# Patient Record
Sex: Male | Born: 1950 | Race: White | Hispanic: No
Health system: Southern US, Community
[De-identification: ages and names within clinical notes are randomized; demographics above are authoritative.]

---

## 2019-10-25 ENCOUNTER — Emergency Department
Admission: EM | Admit: 2019-10-25 | Discharge: 2019-10-29 | Disposition: A | Discharge status: Home / Self Care | Payer: No Typology Code available for payment source | Attending: Emergency Medicine | Admitting: Emergency Medicine

## 2019-10-25 ENCOUNTER — Other Ambulatory Visit: Payer: Self-pay

## 2019-10-25 ENCOUNTER — Emergency Department: Payer: No Typology Code available for payment source

## 2019-10-25 DIAGNOSIS — E119 Type 2 diabetes mellitus without complications: Secondary | ICD-10-CM | POA: Insufficient documentation

## 2019-10-25 DIAGNOSIS — R410 Disorientation, unspecified: Secondary | ICD-10-CM | POA: Diagnosis not present

## 2019-10-25 DIAGNOSIS — I10 Essential (primary) hypertension: Secondary | ICD-10-CM | POA: Insufficient documentation

## 2019-10-25 DIAGNOSIS — E860 Dehydration: Secondary | ICD-10-CM | POA: Insufficient documentation

## 2019-10-25 DIAGNOSIS — R4182 Altered mental status, unspecified: Secondary | ICD-10-CM | POA: Diagnosis present

## 2019-10-25 LAB — COMPREHENSIVE METABOLIC PANEL
ALT: 30 U/L (ref 0–44)
AST: 52 U/L — ABNORMAL HIGH (ref 15–41)
Albumin: 3.2 g/dL — ABNORMAL LOW (ref 3.5–5.0)
Alkaline Phosphatase: 51 U/L (ref 38–126)
Anion gap: 11 (ref 5–15)
BUN: 28 mg/dL — ABNORMAL HIGH (ref 8–23)
CO2: 25 mmol/L (ref 22–32)
Calcium: 9.1 mg/dL (ref 8.9–10.3)
Chloride: 95 mmol/L — ABNORMAL LOW (ref 98–111)
Creatinine, Ser: 1.34 mg/dL — ABNORMAL HIGH (ref 0.61–1.24)
GFR calc Af Amer: >60 mL/min (ref >60)
GFR calc non Af Amer: 54 mL/min — ABNORMAL LOW (ref >60)
Glucose, Bld: 126 mg/dL — ABNORMAL HIGH (ref 70–99)
Potassium: 4.5 mmol/L (ref 3.5–5.1)
Sodium: 131 mmol/L — ABNORMAL LOW (ref 135–145)
Total Bilirubin: 0.6 mg/dL (ref 0.3–1.2)
Total Protein: 7.5 g/dL (ref 6.5–8.1)

## 2019-10-25 LAB — CBC
HCT: 40.8 % (ref 39.0–52.0)
Hemoglobin: 13.9 g/dL (ref 13.0–17.0)
MCH: 29.1 pg (ref 26.0–34.0)
MCHC: 34.1 g/dL (ref 30.0–36.0)
MCV: 85.5 fL (ref 80.0–100.0)
Platelets: 197 10*3/uL (ref 150–400)
RBC: 4.77 MIL/uL (ref 4.22–5.81)
RDW: 12 % (ref 11.5–15.5)
WBC: 7 10*3/uL (ref 4.0–10.5)
nRBC: 0 % (ref 0.0–0.2)

## 2019-10-25 LAB — GLUCOSE, CAPILLARY: Glucose-Capillary: 110 mg/dL — ABNORMAL HIGH (ref 70–99)

## 2019-10-25 NOTE — ED Triage Notes (Signed)
Pt arrives to ED from home via [facility] with c/c of confusion. EMS states that pts daughters were concerned that he has increasing memory loss with patient forgetting where he is driving, and occasional delusional thinking. Transport vitals reported as 134/89, p110, O2 sat 94% on room air. CBG 136. Upon arrival, pt A&Ox4, NAD, no respiratory Sx  Evident.

## 2019-10-26 ENCOUNTER — Emergency Department: Payer: No Typology Code available for payment source

## 2019-10-26 LAB — URINALYSIS, COMPLETE (UACMP) WITH MICROSCOPIC
Bacteria, UA: NONE SEEN
Bilirubin Urine: NEGATIVE
Glucose, UA: NEGATIVE mg/dL
Ketones, ur: 5 mg/dL — AB
Leukocytes,Ua: NEGATIVE
Nitrite: NEGATIVE
Protein, ur: NEGATIVE mg/dL
Specific Gravity, Urine: 1.018 (ref 1.005–1.030)
Squamous Epithelial / HPF: NONE SEEN (ref 0–5)
pH: 5 (ref 5.0–8.0)

## 2019-10-26 LAB — URINE DRUG SCREEN, QUALITATIVE (ARMC ONLY)
Amphetamines, Ur Screen: NOT DETECTED
Barbiturates, Ur Screen: NOT DETECTED
Benzodiazepine, Ur Scrn: NOT DETECTED
Cannabinoid 50 Ng, Ur ~~LOC~~: NOT DETECTED
Cocaine Metabolite,Ur ~~LOC~~: NOT DETECTED
MDMA (Ecstasy)Ur Screen: NOT DETECTED
Methadone Scn, Ur: NOT DETECTED
Opiate, Ur Screen: NOT DETECTED
Phencyclidine (PCP) Ur S: NOT DETECTED
Tricyclic, Ur Screen: NOT DETECTED

## 2019-10-26 LAB — AMMONIA: Ammonia: 15 umol/L (ref 9–35)

## 2019-10-26 LAB — ETHANOL: Alcohol, Ethyl (B): <10 mg/dL (ref <10)

## 2019-10-26 MED ORDER — SODIUM CHLORIDE 0.9 % IV BOLUS
1000.0000 mL | Freq: Once | INTRAVENOUS | Status: AC
  Administered 2019-10-26: 1000 mL via INTRAVENOUS

## 2019-10-26 NOTE — ED Provider Notes (Addendum)
[facility] Emergency Department Provider Note  ____________________________________________  Time seen: Approximately 1:27 AM  I have reviewed the triage vital signs and the nursing notes.   HISTORY  Chief Complaint Altered Mental Status   HPI Shannon Daniels is a 68 y.o. male the history of diabetes, hypertension, bipolar disorder who presents for evaluation of altered mental status.  Patient is accompanied by his daughter who is a Marine scientist at [facility] [employer] [facility] [employer] emergency department.  According to her patient has had mild episodes of confusion and memory loss over the last several months. Today, however patient became extremely confused, sudden onset. Patient was picking up his granddaughter when he he became extremely confused, did not know where he was, was having difficulty driving the car, pulled into a parking lot and would not drive away.  They were eventually able to get home.  Granddaughter got out of the car and called her mother.  She noticed that the patient had not come inside the house and went to check on him.  He was fallen on the ground outside of the house.  When EMS arrived patient was was neuro intact and brought to the emergency room.  Patient endorses feeling very confused but denies headache, neck pain, back pain, chest pain, abdominal pain, nausea, vomiting.  Has had no fever, no cough, no dysuria or hematuria.  He does not drink, does not use drugs or smokes. No new medications  PMH DM HTN Bipolar  Allergies Patient has no allergy information on record.  No family history on file.  Social History Smoking - no Alcohol - no Drugs - no   Review of Systems  Constitutional: Negative for fever. + confusion Eyes: Negative for visual changes. ENT: Negative for sore throat. Neck: No neck pain  Cardiovascular: Negative for chest pain. Respiratory: Negative for shortness of breath. Gastrointestinal: Negative for abdominal pain, vomiting or diarrhea.  Genitourinary: Negative for dysuria. Musculoskeletal: Negative for back pain. Skin: Negative for rash. Neurological: Negative for headaches, weakness or numbness. Psych: No SI or HI  ____________________________________________   PHYSICAL EXAM:  VITAL SIGNS: ED Triage Vitals  Enc Vitals Group     BP 10/25/19 2257 (!) 126/92     Pulse Rate 10/25/19 2257 (!) 102     Resp 10/25/19 2257 16     Temp 10/25/19 2257 99.3 F (37.4 C) - 98.91F repeat     Temp Source 10/25/19 2257 Oral     SpO2 10/25/19 2257 100 %     Weight 10/25/19 2258 230 lb (104.3 kg)     Height 10/25/19 2258 5\' 8"  (1.727 m)     Head Circumference --      Peak Flow --      Pain Score 10/25/19 2258 0     Pain Loc --      Pain Edu? --      Excl. in [place]? --     Constitutional: Alert and oriented to self, thinks he is at [facility], does not know the year, month, or season, does not know the president. No distress HEENT:      Head: Normocephalic and atraumatic.         Eyes: Conjunctivae are normal. Sclera is non-icteric.       Mouth/Throat: Mucous membranes are moist.       Neck: Supple with no signs of meningismus. Cardiovascular: Regular rate and rhythm. No murmurs, gallops, or rubs. 2+ symmetrical distal pulses are present in all extremities. No JVD. Respiratory: Normal respiratory  effort. Lungs are clear to auscultation bilaterally. No wheezes, crackles, or rhonchi.  Gastrointestinal: Soft, non tender, and non distended with positive bowel sounds. No rebound or guarding. Musculoskeletal: Nontender with normal range of motion in all extremities. No edema, cyanosis, or erythema of extremities. Neurologic: Normal speech and language. Face is symmetric. EOMI, PERRL, strength and sensation x 4 intact, no dysmetria or pronator drift. Able to read and name objects with no difficulty finding words.  Skin: Skin is warm, dry and intact. No rash noted. Psychiatric: Mood and affect are normal. Speech and behavior are  normal.  ____________________________________________   LABS (all labs ordered are listed, but only abnormal results are displayed)  Labs Reviewed  COMPREHENSIVE METABOLIC PANEL - Abnormal; Notable for the following components:      Result Value   Sodium 131 (*)    Chloride 95 (*)    Glucose, Bld 126 (*)    BUN 28 (*)    Creatinine, Ser 1.34 (*)    Albumin 3.2 (*)    AST 52 (*)    GFR calc non Af Amer 54 (*)    All other components within normal limits  URINALYSIS, COMPLETE (UACMP) WITH MICROSCOPIC - Abnormal; Notable for the following components:   Color, Urine YELLOW (*)    APPearance CLEAR (*)    Hgb urine dipstick SMALL (*)    Ketones, ur 5 (*)    All other components within normal limits  GLUCOSE, CAPILLARY - Abnormal; Notable for the following components:   Glucose-Capillary 110 (*)    All other components within normal limits  CBC  ETHANOL  URINE DRUG SCREEN, QUALITATIVE ([facility] ONLY)  AMMONIA  CBG MONITORING, ED   ____________________________________________  EKG  ED ECG REPORT I, Nita Sickle, the attending physician, personally viewed and interpreted this ECG.  Sinus tachycardia, rate of 100, normal intervals, left axis deviation, no ST elevations or depressions.  No prior for comparison ____________________________________________  RADIOLOGY  I have personally reviewed the images performed during this visit and I agree with the Radiologist's read.   Interpretation by Radiologist:  Ct Head Wo Contrast  Result Date: 10/26/2019 CLINICAL DATA:  Confusion EXAM: CT HEAD WITHOUT CONTRAST TECHNIQUE: Contiguous axial images were obtained from the base of the skull through the vertex without intravenous contrast. COMPARISON:  None. FINDINGS: Brain: Mild atrophic changes are noted. No findings to suggest acute hemorrhage, acute infarction or space-occupying mass lesion are noted. Vascular: No hyperdense vessel or unexpected calcification. Skull: Normal.  Negative for fracture or focal lesion. Sinuses/Orbits: No acute finding. Other: None. IMPRESSION: Mild atrophic changes without acute abnormality. Electronically Signed   By: Alcide Clever M.D.   On: 10/26/2019 00:18   Mr Brain Wo Contrast  Result Date: 10/26/2019 CLINICAL DATA:  Altered mental status with unclear cause. Progressive memory loss EXAM: MRI HEAD WITHOUT CONTRAST TECHNIQUE: Multiplanar, multiecho pulse sequences of the brain and surrounding structures were obtained without intravenous contrast. COMPARISON:  None. FINDINGS: Brain: No acute infarction, hemorrhage, hydrocephalus, extra-axial collection or mass lesion. Overall mild cerebral volume loss for age. Medial temporal volume is preserved. Unremarkable cerebral white matter. Vascular: Normal flow voids Skull and upper cervical spine: Normal marrow signal Sinuses/Orbits: Generalized mucosal thickening in paranasal sinuses. Bilateral cataract resection. IMPRESSION: 1. No acute or reversible finding. No specific cause of memory loss. 2. Mild cerebral atrophy. 3. Generalized chronic appearing sinusitis Electronically Signed   By: Marnee Spring M.D.   On: 10/26/2019 04:17     ____________________________________________   PROCEDURES  Procedure(s) performed: None Procedures Critical Care performed:  None ____________________________________________   INITIAL IMPRESSION / ASSESSMENT AND PLAN / ED COURSE   68 y.o. male the history of diabetes, hypertension, bipolar disorder who presents for evaluation of altered mental status.  Patient is alert and oriented to self, thinks he is [facility], does not know the year, month, or season, does not know the president.  He does seem slightly confused and has good insight into his confusion.  He denies any medical complaints.  His vitals show mild tachycardia with a pulse of 100, afebrile with a temp of 98.98F during my evaluation.  He is otherwise completely neurologically intact.  No meningeal signs, no fever, no HA or neck pain.  Labs consistent with mild dehydration with ketones in his urine.  CMP showing creatinine of 1.34 but normal electrolytes.  No prior labs available for comparison. CBC within normal limits. UA negative for UTI.  Drug screen negative.  Alcohol level negative. CT head negative for bleed or any acute findings. Will get MRI to rule out stroke. Will give IVF. Ddx stroke, dementia, infection, dehydration.    _________________________ 4:48 AM on 10/26/2019 -----------------------------------------  MRI negative. Patient less confused, knows the president, knows about recent events including COVID and elections, still confused on year. Ammonia negative. Unclear cause of his symptoms, possibly dementia. Will refer back to PCP for further evaluation. In the meantime, discussed with daughter to prevent patient from driving and close monitoring at home. Discussed my standard return precautions.   _________________________ 6:46 AM on 10/26/2019 -----------------------------------------  IV infiltrated and about 500 cc of NS infiltrated on patient's left arm. Limb was elevated, ice applied and patient was monitored for 1 hour post infiltration with significant improvement of the swelling.  Compartments are very soft, no pain, brisk capillary refill and normal distal pulses.  Recommended elevation and ice at home.  Discussed return precautions for signs of compartment syndrome  As part of my medical decision making, I reviewed the following data within the electronic MEDICAL RECORD NUMBER History obtained from family, Nursing notes reviewed and incorporated, Labs reviewed , EKG interpreted , Radiograph reviewed , Notes from prior ED visits and [place] Controlled Substance Database   Patient was evaluated in Emergency Department today for the symptoms described in the history of present illness. Patient was evaluated in the context of the global COVID-19 pandemic, which  necessitated consideration that the patient might be at risk for infection with the SARS-CoV-2 virus that causes COVID-19. Institutional protocols and algorithms that pertain to the evaluation of patients at risk for COVID-19 are in a state of rapid change based on information released by regulatory bodies including the CDC and federal and state organizations. These policies and algorithms were followed during the patient's care in the ED.   ____________________________________________   FINAL CLINICAL IMPRESSION(S) / ED DIAGNOSES   Final diagnoses:  Confusion  Dehydration      NEW MEDICATIONS STARTED DURING THIS VISIT:  ED Discharge Orders    None       Note:  This document was prepared using Dragon voice recognition software and may include unintentional dictation errors.    Nita SickleVeronese, [place], MD 10/26/19 [id number]    Nita SickleVeronese, [place], MD 10/26/19 [id number]    Nita SickleVeronese, [place], MD 10/26/19 [phone]

## 2019-10-26 NOTE — ED Notes (Signed)
Upon discontinuing the patients IV, this nurse noted that his left forearm was swollen and cold to the touch. Pts IV infiltrated and part or all of the 1000 mL of NS infiltrated into his forearm. Radial pulse strong and distal cap refill <3 sec. Pt denies any pain. Dr Alfred Levins notified. Received verbal orders to elevate affected arm and hold patient for observation.

## 2020-04-10 ENCOUNTER — Ambulatory Visit: Payer: Self-pay

## 2020-05-09 ENCOUNTER — Ambulatory Visit: Payer: Self-pay | Attending: Internal Medicine

## 2020-05-09 ENCOUNTER — Other Ambulatory Visit: Payer: Self-pay

## 2020-05-09 DIAGNOSIS — Z23 Encounter for immunization: Secondary | ICD-10-CM

## 2020-05-09 NOTE — Progress Notes (Signed)
   Covid-19 Vaccination Clinic  Name:  Jabin Tapp    MRN: [id number] DOB: 1951/08/11  05/09/2020  Mr. Garraway was observed post Covid-19 immunization for 15 minutes without incident. He was provided with Vaccine Information Sheet and instruction to access the V-Safe system.   Mr. Lasser was instructed to call 911 with any severe reactions post vaccine: Marland Kitchen Difficulty breathing  . Swelling of face and throat  . A fast heartbeat  . A bad rash all over body  . Dizziness and weakness   Immunizations Administered    Name Date Dose VIS Date Route   Pfizer COVID-19 Vaccine 05/09/2020  9:26 AM 0.3 mL 02/20/2019 Intramuscular   Manufacturer: [employer], [employer]   Lot: [id number]   NDC: [address]-4

## 2020-06-03 ENCOUNTER — Ambulatory Visit: Payer: Medicare Other | Attending: Internal Medicine

## 2020-06-03 DIAGNOSIS — Z23 Encounter for immunization: Secondary | ICD-10-CM

## 2020-06-03 NOTE — Progress Notes (Signed)
   Covid-19 Vaccination Clinic  Name:  Holdan Stucke    MRN: [id number] DOB: 1951-10-09  06/03/2020  Mr. Mcglown was observed post Covid-19 immunization for 15 minutes without incident. He was provided with Vaccine Information Sheet and instruction to access the V-Safe system.   Mr. Massi was instructed to call 911 with any severe reactions post vaccine: Marland Kitchen Difficulty breathing  . Swelling of face and throat  . A fast heartbeat  . A bad rash all over body  . Dizziness and weakness   Immunizations Administered    Name Date Dose VIS Date Route   Pfizer COVID-19 Vaccine 06/03/2020  9:28 AM 0.3 mL 02/20/2019 Intramuscular   Manufacturer: [employer], [employer]   Lot: [id number]   NDC: [address]-6

## 2021-06-21 IMAGING — MR MR HEAD W/O CM
10 of 11 series · 42 of 48 positions shown · non-contrast
Comparison: None.

CLINICAL DATA: Altered mental status with unclear cause.
Progressive memory loss

EXAM:
MRI HEAD WITHOUT CONTRAST
TECHNIQUE: Multiplanar, multiecho pulse sequences of the brain and surrounding
structures were obtained without intravenous contrast.

[Series 2: ax dwi_tracew · axial · 3.0mm · 0.83mm/px · z∈[-98,+65]mm · 7 of 56 slices shown]
[im 1/56]
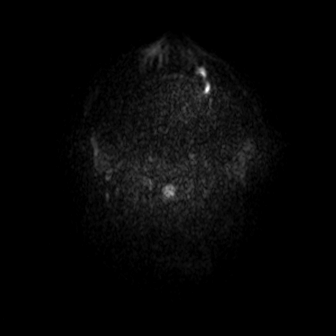
[im 10/56]
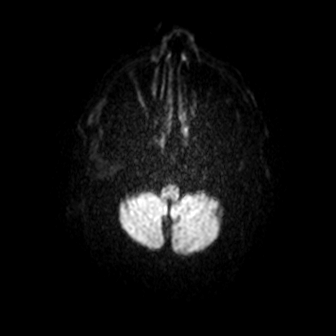
[im 19/56]
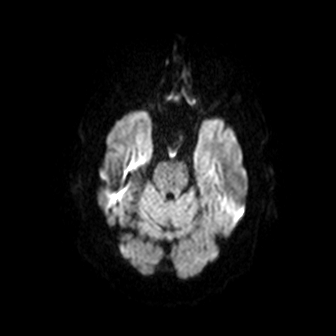
[im 28/56]
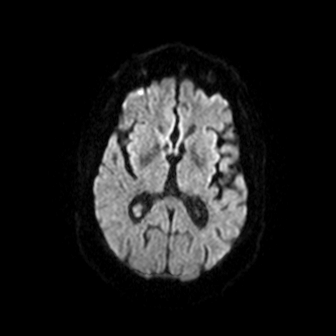
[im 37/56]
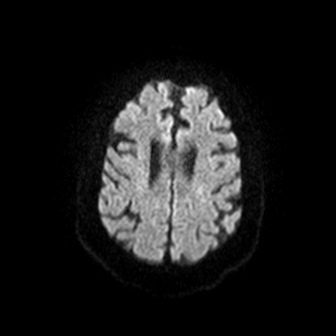
[im 46/56]
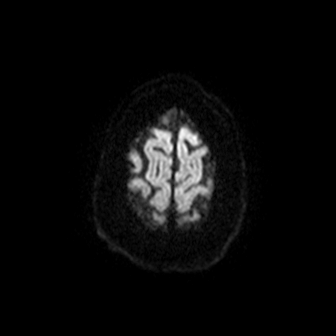
[im 56/56]
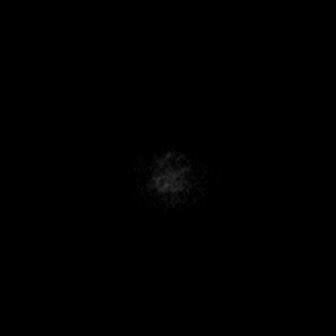

[Series 3: ax dwi_adc · axial · 3.0mm · 0.83mm/px · z∈[-98,+65]mm · 7 of 56 slices shown]
[im 1/56]
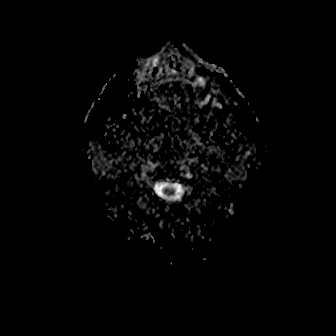
[im 10/56]
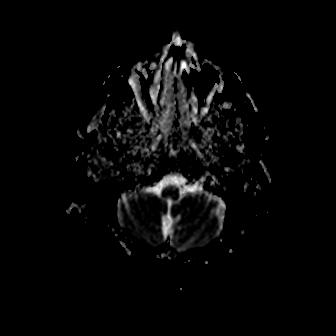
[im 19/56]
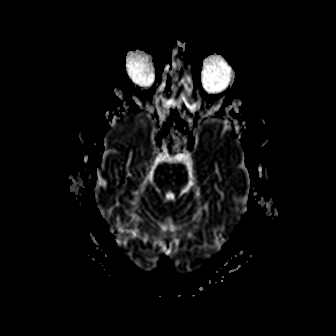
[im 28/56]
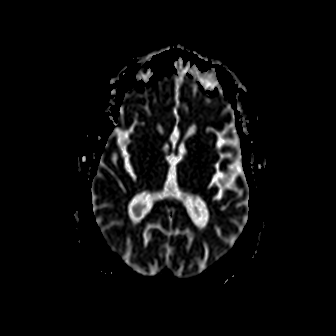
[im 37/56]
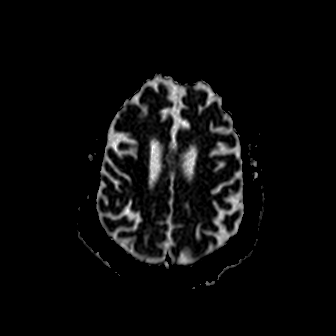
[im 46/56]
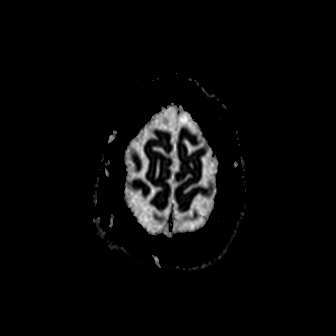
[im 56/56]
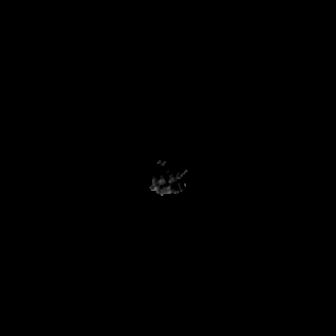

[Series 4: cor dwi_tracew · coronal · 5.0mm · 0.68mm/px · 5 of 40 slices shown]
[im 1/40]
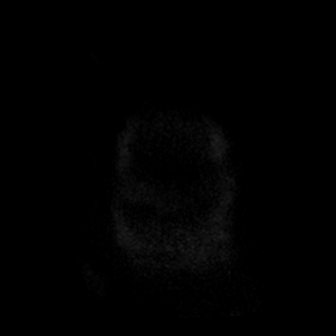
[im 10/40]
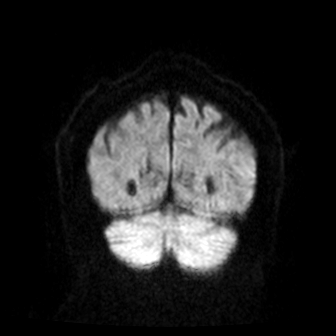
[im 20/40]
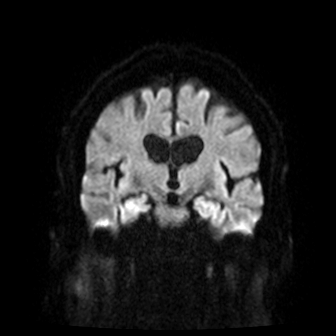
[im 30/40]
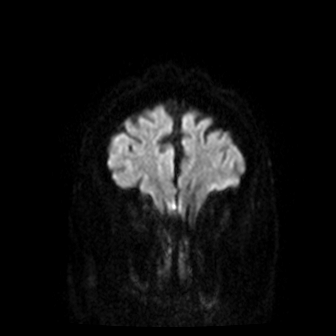
[im 40/40]
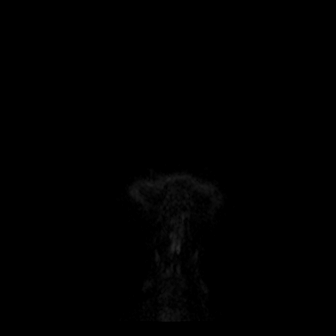

[Series 5: cor dwi_adc · coronal · 5.0mm · 0.68mm/px · 3 of 40 slices shown]
[im 1/40]
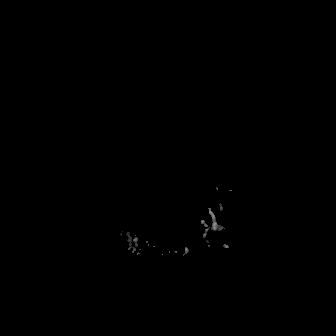
[im 10/40]
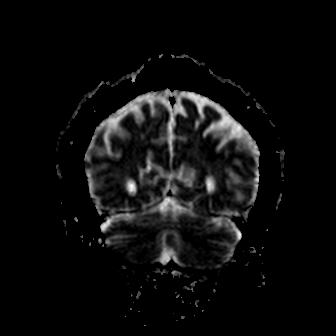
[im 20/40]
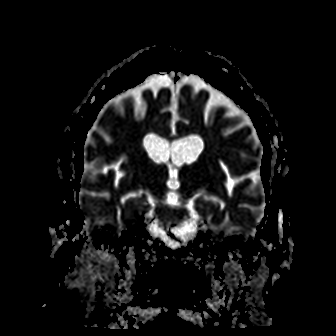

[Series 6: T1 · sagittal · 5.0mm · 0.94mm/px · 3 of 25 slices shown (1 of 2)]
[im 1/25]
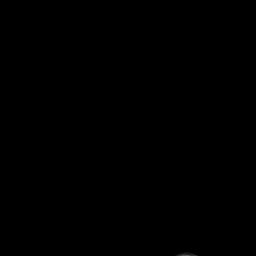
[im 13/25]
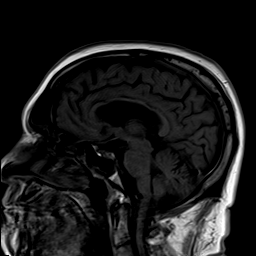
[im 25/25]
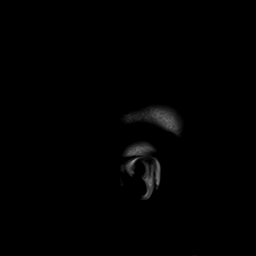

[Series 7: T2 · axial · 5.0mm · 0.45mm/px · 1 of 7 slices shown (1 of 3)]
[im 1/7]
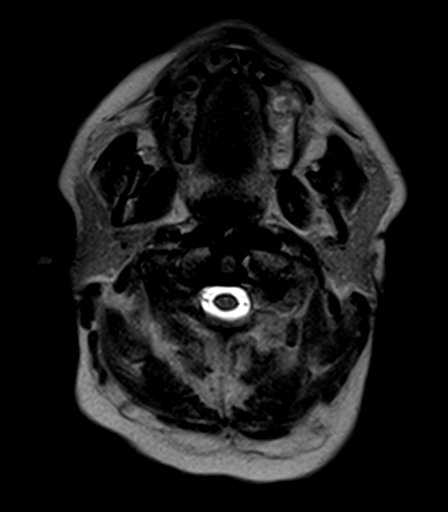

[Series 8: T2 · axial · 5.0mm · 0.45mm/px · z∈[-98,+57]mm · 4 of 27 slices shown (2 of 3)]
[im 1/27]
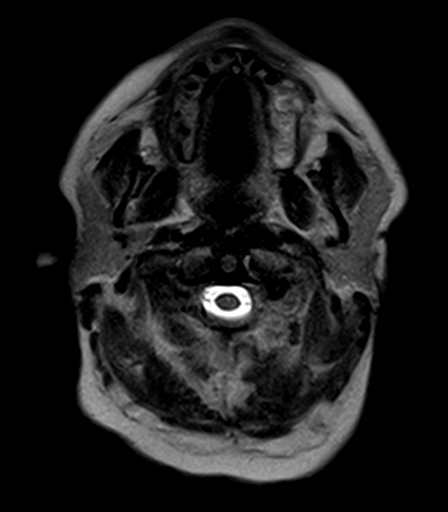
[im 9/27]
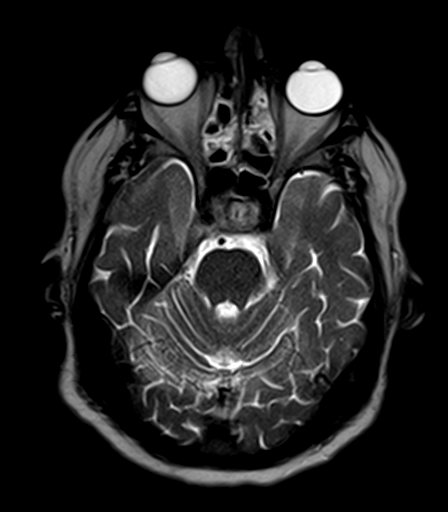
[im 18/27]
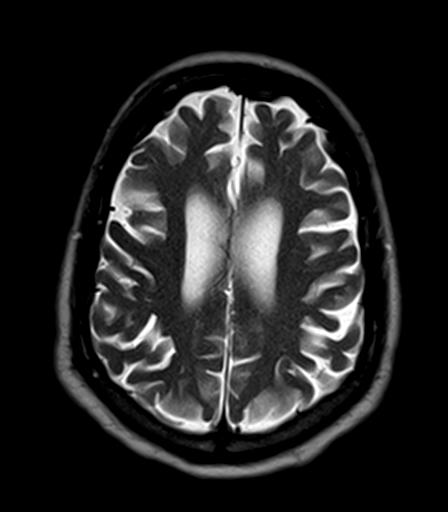
[im 27/27]
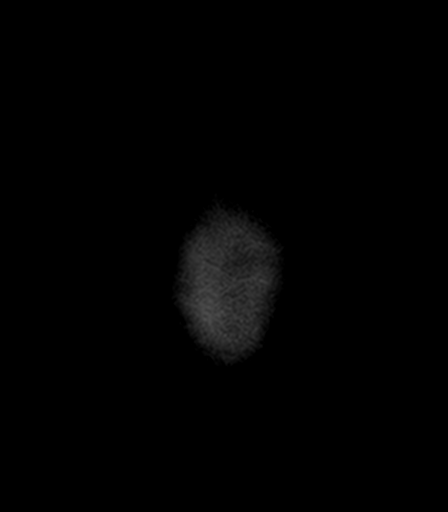

[Series 10: FLAIR · axial · 5.0mm · 1.20mm/px · z∈[-98,+57]mm · 4 of 27 slices shown]
[im 1/27]
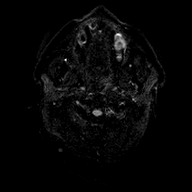
[im 9/27]
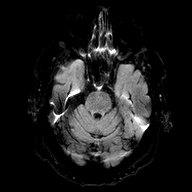
[im 18/27]
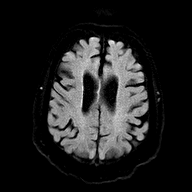
[im 27/27]
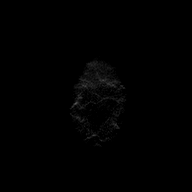

[Series 11: T1 · axial · 5.0mm · 0.90mm/px · z∈[-98,+57]mm · 4 of 27 slices shown (2 of 2)]
[im 1/27]
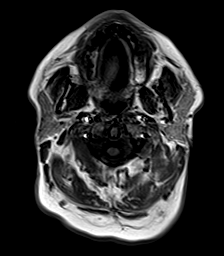
[im 9/27]
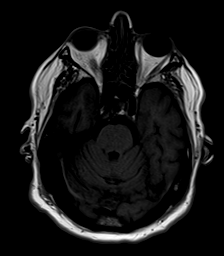
[im 18/27]
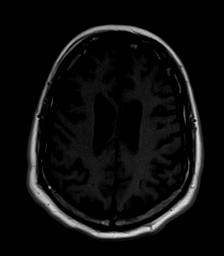
[im 27/27]
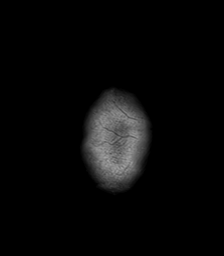

[Series 12: T2 · coronal · 5.0mm · 0.45mm/px · 4 of 31 slices shown (3 of 3)]
[im 1/31]
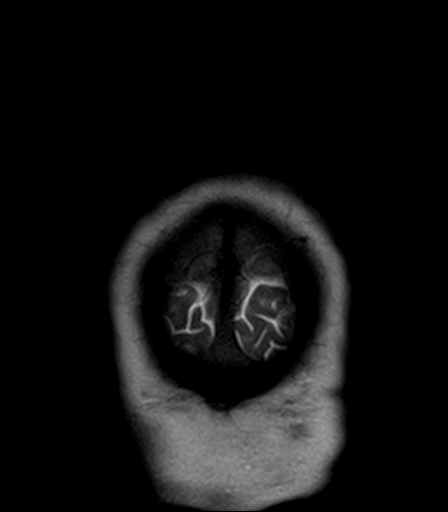
[im 11/31]
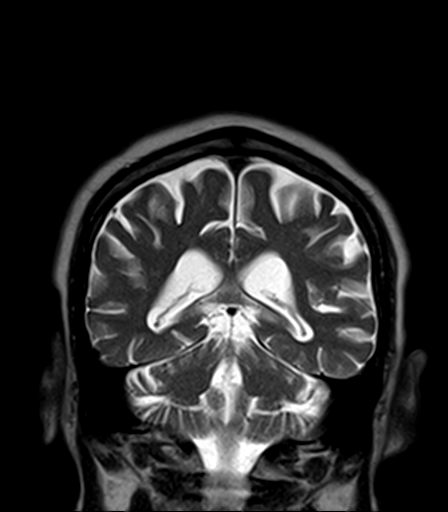
[im 21/31]
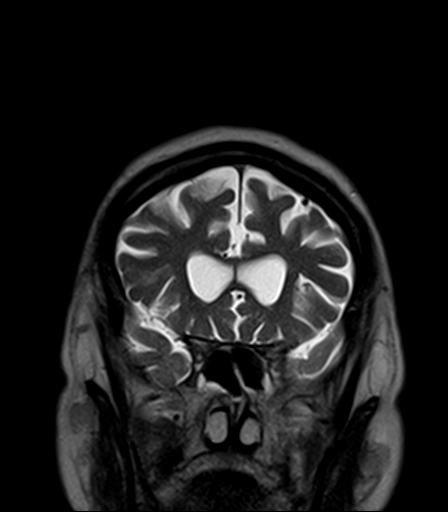
[im 31/31]
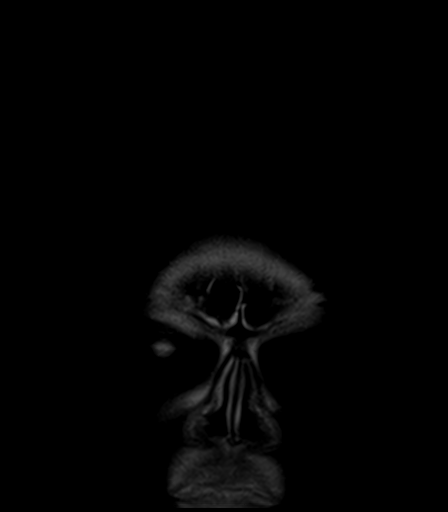

[42 of 48 positions shown; findings below may reference images not displayed]

FINDINGS: Brain: No acute infarction, hemorrhage, hydrocephalus, extra-axial
collection or mass lesion. Overall mild cerebral volume loss for
age. Medial temporal volume is preserved. Unremarkable cerebral
white matter.

Vascular: Normal flow voids

Skull and upper cervical spine: Normal marrow signal

Sinuses/Orbits: Generalized mucosal thickening in paranasal sinuses.
Bilateral cataract resection.
IMPRESSION: 1. No acute or reversible finding. No specific cause of memory loss.
2. Mild cerebral atrophy.
3. Generalized chronic appearing sinusitis

## 2022-10-13 ENCOUNTER — Other Ambulatory Visit: Payer: Self-pay

## 2022-10-13 ENCOUNTER — Encounter (HOSPITAL_COMMUNITY): Payer: Self-pay

## 2022-10-13 ENCOUNTER — Emergency Department (HOSPITAL_COMMUNITY): Payer: No Typology Code available for payment source

## 2022-10-13 ENCOUNTER — Emergency Department (HOSPITAL_COMMUNITY)
Admission: EM | Admit: 2022-10-13 | Discharge: 2022-10-13 | Disposition: A | Discharge status: Home / Self Care | Payer: No Typology Code available for payment source | Attending: Emergency Medicine | Admitting: Emergency Medicine

## 2022-10-13 DIAGNOSIS — S0101XA Laceration without foreign body of scalp, initial encounter: Secondary | ICD-10-CM | POA: Insufficient documentation

## 2022-10-13 DIAGNOSIS — W01198A Fall on same level from slipping, tripping and stumbling with subsequent striking against other object, initial encounter: Secondary | ICD-10-CM | POA: Diagnosis not present

## 2022-10-13 DIAGNOSIS — E119 Type 2 diabetes mellitus without complications: Secondary | ICD-10-CM | POA: Diagnosis not present

## 2022-10-13 DIAGNOSIS — I10 Essential (primary) hypertension: Secondary | ICD-10-CM | POA: Diagnosis not present

## 2022-10-13 DIAGNOSIS — S0990XA Unspecified injury of head, initial encounter: Secondary | ICD-10-CM | POA: Diagnosis present

## 2022-10-13 DIAGNOSIS — R42 Dizziness and giddiness: Secondary | ICD-10-CM | POA: Insufficient documentation

## 2022-10-13 LAB — COMPREHENSIVE METABOLIC PANEL
ALT: 23 U/L (ref 0–44)
AST: 22 U/L (ref 15–41)
Albumin: 3.4 g/dL — ABNORMAL LOW (ref 3.5–5.0)
Alkaline Phosphatase: 58 U/L (ref 38–126)
Anion gap: 11 (ref 5–15)
BUN: 36 mg/dL — ABNORMAL HIGH (ref 8–23)
CO2: 22 mmol/L (ref 22–32)
Calcium: 9.4 mg/dL (ref 8.9–10.3)
Chloride: 102 mmol/L (ref 98–111)
Creatinine, Ser: 1.05 mg/dL (ref 0.61–1.24)
GFR, Estimated: >60 mL/min (ref >60)
Glucose, Bld: 143 mg/dL — ABNORMAL HIGH (ref 70–99)
Potassium: 4.5 mmol/L (ref 3.5–5.1)
Sodium: 135 mmol/L (ref 135–145)
Total Bilirubin: 0.5 mg/dL (ref 0.3–1.2)
Total Protein: 6.5 g/dL (ref 6.5–8.1)

## 2022-10-13 LAB — CBC WITH DIFFERENTIAL/PLATELET
Abs Immature Granulocytes: 0.04 10*3/uL (ref 0.00–0.07)
Basophils Absolute: 0.1 10*3/uL (ref 0.0–0.1)
Basophils Relative: 1 %
Eosinophils Absolute: 0.3 10*3/uL (ref 0.0–0.5)
Eosinophils Relative: 3 %
HCT: 41.5 % (ref 39.0–52.0)
Hemoglobin: 13.8 g/dL (ref 13.0–17.0)
Immature Granulocytes: 1 %
Lymphocytes Relative: 32 %
Lymphs Abs: 2.7 10*3/uL (ref 0.7–4.0)
MCH: 29.6 pg (ref 26.0–34.0)
MCHC: 33.3 g/dL (ref 30.0–36.0)
MCV: 88.9 fL (ref 80.0–100.0)
Monocytes Absolute: 0.9 10*3/uL (ref 0.1–1.0)
Monocytes Relative: 11 %
Neutro Abs: 4.5 10*3/uL (ref 1.7–7.7)
Neutrophils Relative %: 52 %
Platelets: 208 10*3/uL (ref 150–400)
RBC: 4.67 MIL/uL (ref 4.22–5.81)
RDW: 12.7 % (ref 11.5–15.5)
WBC: 8.5 10*3/uL (ref 4.0–10.5)
nRBC: 0 % (ref 0.0–0.2)

## 2022-10-13 LAB — URINALYSIS, ROUTINE W REFLEX MICROSCOPIC
Bilirubin Urine: NEGATIVE
Glucose, UA: 50 mg/dL — AB
Hgb urine dipstick: NEGATIVE
Ketones, ur: 5 mg/dL — AB
Leukocytes,Ua: NEGATIVE
Nitrite: NEGATIVE
Protein, ur: NEGATIVE mg/dL
Specific Gravity, Urine: 1.016 (ref 1.005–1.030)
pH: 5 (ref 5.0–8.0)

## 2022-10-13 LAB — CBG MONITORING, ED: Glucose-Capillary: 159 mg/dL — ABNORMAL HIGH (ref 70–99)

## 2022-10-13 MED ORDER — LIDOCAINE-EPINEPHRINE-TETRACAINE (LET) TOPICAL GEL
3.0000 mL | Freq: Once | TOPICAL | Status: AC
  Administered 2022-10-13: 3 mL via TOPICAL
  Filled 2022-10-13: qty 3

## 2022-10-13 NOTE — ED Triage Notes (Signed)
Pt reports dizziness while ambulating. Pt fell pta, head lac, denies blood thinners.

## 2022-10-13 NOTE — ED Notes (Signed)
Patient transported to CT 

## 2022-10-13 NOTE — Discharge Instructions (Signed)
You were seen today after a fall.  Your scalp was repaired with 2 staples which should be removed in 7 days.  Please keep the wound clean.  I recommend following up with your primary care provider for further evaluation of your recent dizziness and lightheadedness.  If you develop any life-threatening condition such as shortness of breath, chest pain, or altered level of consciousness, please return to the emergency department for reevaluation

## 2022-10-13 NOTE — ED Provider Notes (Signed)
[facility] DEPT Provider Note   CSN: [id number] Arrival date & time: 10/13/22  5465     History  Chief Complaint  Patient presents with   Dizziness    Shannon Daniels is a 72 y.o. male.  Patient presents to the hospital complaining of a scalp laceration secondary to fall.  Patient reports that for the past few weeks he has had dizziness while ambulating.  This afternoon the patient was reportedly working in the yard, and felt lightheaded upon entering the home.  He states he did not lose consciousness but felt weak and fell, hitting the back of his head on a coffee table.  He denies using blood thinners.  Patient currently denies abdominal pain, chest pain, shortness of breath, headache, urinary symptoms.  He endorses increasing weakness and dizziness, described as feeling off balance, along with the head injury.  Past medical history significant for alcohol abuse in remission, bipolar affective disorder, type 2 diabetes, hyperlipidemia, hypertension, vitamin D deficiency  HPI     Home Medications Prior to Admission medications   Not on File      Allergies    Patient has no allergy information on record.    Review of Systems   Review of Systems  Constitutional:  Negative for fever.  Respiratory:  Negative for shortness of breath.   Cardiovascular:  Negative for chest pain.  Musculoskeletal:  Negative for arthralgias.  Skin:  Positive for wound.  Neurological:  Positive for dizziness and light-headedness.    Physical Exam Updated Vital Signs BP 119/86   Pulse 90   Temp 98.1 F (36.7 C) (Oral)   Resp 14   Ht 5\' 10"  (1.778 m)   Wt 90.7 kg   SpO2 97%   BMI 28.69 kg/m  Physical Exam Vitals and nursing note reviewed.  Constitutional:      General: He is not in acute distress.    Appearance: He is well-developed.  HENT:     Head: Normocephalic and atraumatic.     Right Ear: Tympanic membrane normal.     Left Ear: Tympanic membrane  normal.     Mouth/Throat:     Mouth: Mucous membranes are moist.  Eyes:     Conjunctiva/sclera: Conjunctivae normal.  Cardiovascular:     Rate and Rhythm: Normal rate and regular rhythm.     Heart sounds: No murmur heard. Pulmonary:     Effort: Pulmonary effort is normal. No respiratory distress.     Breath sounds: Normal breath sounds.  Abdominal:     Palpations: Abdomen is soft.     Tenderness: There is no abdominal tenderness.  Musculoskeletal:        General: No swelling.     Cervical back: Neck supple.  Skin:    General: Skin is warm and dry.     Capillary Refill: Capillary refill takes less than 2 seconds.     Comments: 2 cm M shaped laceration noted to patient's scalp on the posterior region on the right side  Neurological:     Mental Status: He is alert.     Comments: CN II through VII, XI, XII intact  Psychiatric:        Mood and Affect: Mood normal.     ED Results / Procedures / Treatments   Labs (all labs ordered are listed, but only abnormal results are displayed) Labs Reviewed  COMPREHENSIVE METABOLIC PANEL - Abnormal; Notable for the following components:      Result Value   Glucose,  Bld 143 (*)    BUN 36 (*)    Albumin 3.4 (*)    All other components within normal limits  URINALYSIS, ROUTINE W REFLEX MICROSCOPIC - Abnormal; Notable for the following components:   Glucose, UA 50 (*)    Ketones, ur 5 (*)    All other components within normal limits  CBG MONITORING, ED - Abnormal; Notable for the following components:   Glucose-Capillary 159 (*)    All other components within normal limits  CBC WITH DIFFERENTIAL/PLATELET    EKG None  Radiology DG Chest 2 View  Result Date: 10/13/2022 CLINICAL DATA:  Weakness. Fall, dizzy. EXAM: CHEST - 2 VIEW COMPARISON:  None Available. FINDINGS: Very low lung volumes limit assessment. Prominent heart size is likely accentuated by low lung and technique. No pulmonary edema, pleural effusion, focal airspace disease  or pneumothorax. On limited assessment, no acute osseous findings. IMPRESSION: Low lung volumes without acute pulmonary process. Electronically Signed   By: Narda Rutherford M.D.   On: 10/13/2022 20:17   CT HEAD WO CONTRAST  Result Date: 10/13/2022 CLINICAL DATA:  Head trauma, minor (Age >= 65y) Patient reports dizziness. EXAM: CT HEAD WITHOUT CONTRAST TECHNIQUE: Contiguous axial images were obtained from the base of the skull through the vertex without intravenous contrast. RADIATION DOSE REDUCTION: This exam was performed according to the departmental dose-optimization program which includes automated exposure control, adjustment of the mA and/or kV according to patient size and/or use of iterative reconstruction technique. COMPARISON:  Head CT 10/25/2019 FINDINGS: Brain: Stable generalized atrophy. No intracranial hemorrhage, mass effect, or midline shift. No hydrocephalus. The basilar cisterns are patent. No evidence of territorial infarct or acute ischemia. No extra-axial or intracranial fluid collection. Vascular: Atherosclerosis of skullbase vasculature without hyperdense vessel or abnormal calcification. Skull: No fracture or focal lesion. Sinuses/Orbits: Choose mucosal thickening of ethmoid air cells. No sinus fluid levels. No mastoid effusion. Other: None. IMPRESSION: 1. No acute intracranial abnormality. No skull fracture. 2. Stable atrophy. Electronically Signed   By: Narda Rutherford M.D.   On: 10/13/2022 20:10    Procedures .Marland KitchenLaceration Repair  Date/Time: 10/13/2022 9:40 PM  Performed by: Darrick Grinder, PA-C Authorized by: Darrick Grinder, PA-C   Consent:    Consent obtained:  Verbal   Consent given by:  Patient   Risks, benefits, and alternatives were discussed: yes     Risks discussed:  Pain, poor cosmetic result and poor wound healing   Alternatives discussed:  No treatment Universal protocol:    Procedure explained and questions answered to patient or proxy's  satisfaction: yes     Relevant documents present and verified: yes     Test results available: yes     Imaging studies available: yes     Required blood products, implants, devices, and special equipment available: yes     Immediately prior to procedure, a time out was called: yes     Patient identity confirmed:  Verbally with patient Anesthesia:    Anesthesia method:  Topical application   Topical anesthetic:  LET Laceration details:    Location:  Scalp   Scalp location:  R parietal   Length (cm):  2   Depth (mm):  3 Pre-procedure details:    Preparation:  Patient was prepped and draped in usual sterile fashion Exploration:    Hemostasis achieved with:  Direct pressure   Imaging outcome: foreign body not noted     Wound exploration: wound explored through full range of motion  Contaminated: no   Treatment:    Area cleansed with:  Shur-Clens   Amount of cleaning:  Standard   Debridement:  None   Undermining:  None Skin repair:    Repair method:  Staples   Number of staples:  2 Approximation:    Approximation:  Close Repair type:    Repair type:  Simple Post-procedure details:    Dressing:  Open (no dressing)   Procedure completion:  Tolerated well, no immediate complications     Medications Ordered in ED Medications  lidocaine-EPINEPHrine-tetracaine (LET) topical gel (3 mLs Topical Given 10/13/22 2045)    ED Course/ Medical Decision Making/ A&P                           Medical Decision Making Amount and/or Complexity of Data Reviewed Labs: ordered. Radiology: ordered. ECG/medicine tests: ordered.   This patient presents to the ED for concern of a fall, this involves an extensive number of treatment options, and is a complaint that carries with it a high risk of complications and morbidity.  The differential diagnosis includes intracranial abnormality, stroke, soft tissue injury, electrolyte abnormality, hypotension, and others   Co morbidities that  complicate the patient evaluation  History of diabetes   Additional history obtained:  Additional history obtained from family at bedside External records from outside source obtained and reviewed including notes from the [place] documenting patient's medical history   Lab Tests:  I Ordered, and personally interpreted labs.  The pertinent results include: Grossly unremarkable CBC, CMP, UA, CBG   Imaging Studies ordered:  I ordered imaging studies including head CT, chest x-ray I independently visualized and interpreted imaging which showed negative head CT, low lung volumes without acute process on chest x-ray I agree with the radiologist interpretation   Cardiac Monitoring: / EKG:  The patient was maintained on a cardiac monitor.  I personally viewed and interpreted the cardiac monitored which showed an underlying rhythm of: Sinus rhythm  Problem List / ED Course / Critical interventions / Medication management   I ordered medication including LAT for topical pain management Reevaluation of the patient after these medicines showed that the patient improved I have reviewed the patients home medicines and have made adjustments as needed   Social Determinants of Health:  [facility]   Test / Admission - Considered:  Work-up was grossly unremarkable.  No signs of CVA with normal neuro exam.  CT head unremarkable.  Labs show mildly elevated glucose, mildly elevated BUN, otherwise unremarkable.  Unclear as to cause of patient's recent dizziness/lightheadedness.  Question if this may be related to multiple medications the patient is taking.  Recommend the patient follow-up with his [facility] provider for further evaluation and management of his lightheadedness.  He did have an M shaped wound on the scalp which was repaired with 2 staples.  They should be removed in approximately 7 days by any healthcare provider.  Patient understands recommendations.  There is no indication for  admission.  Discharge home        Final Clinical Impression(s) / ED Diagnoses Final diagnoses:  Dizziness  Scalp laceration, initial encounter    Rx / DC Orders ED Discharge Orders     None         Pamala Duffel 10/13/22 2145    Franne Forts, DO 10/14/22 0123

## 2023-01-26 ENCOUNTER — Ambulatory Visit
Admission: EM | Admit: 2023-01-26 | Discharge: 2023-01-26 | Disposition: A | Discharge status: Home / Self Care | Payer: Medicare Other

## 2023-01-26 DIAGNOSIS — Z9849 Cataract extraction status, unspecified eye: Secondary | ICD-10-CM | POA: Insufficient documentation

## 2023-01-26 DIAGNOSIS — Z719 Counseling, unspecified: Secondary | ICD-10-CM | POA: Insufficient documentation

## 2023-01-26 DIAGNOSIS — H25049 Posterior subcapsular polar age-related cataract, unspecified eye: Secondary | ICD-10-CM | POA: Insufficient documentation

## 2023-01-26 DIAGNOSIS — Z7729 Contact with and (suspected ) exposure to other hazardous substances: Secondary | ICD-10-CM | POA: Insufficient documentation

## 2023-01-26 DIAGNOSIS — R948 Abnormal results of function studies of other organs and systems: Secondary | ICD-10-CM | POA: Insufficient documentation

## 2023-01-26 DIAGNOSIS — F1011 Alcohol abuse, in remission: Secondary | ICD-10-CM | POA: Insufficient documentation

## 2023-01-26 DIAGNOSIS — M7631 Iliotibial band syndrome, right leg: Secondary | ICD-10-CM | POA: Diagnosis not present

## 2023-01-26 DIAGNOSIS — H269 Unspecified cataract: Secondary | ICD-10-CM | POA: Insufficient documentation

## 2023-01-26 DIAGNOSIS — E559 Vitamin D deficiency, unspecified: Secondary | ICD-10-CM | POA: Insufficient documentation

## 2023-01-26 DIAGNOSIS — F3132 Bipolar disorder, current episode depressed, moderate: Secondary | ICD-10-CM | POA: Insufficient documentation

## 2023-01-26 DIAGNOSIS — E119 Type 2 diabetes mellitus without complications: Secondary | ICD-10-CM | POA: Insufficient documentation

## 2023-01-26 DIAGNOSIS — F319 Bipolar disorder, unspecified: Secondary | ICD-10-CM | POA: Insufficient documentation

## 2023-01-26 DIAGNOSIS — I10 Essential (primary) hypertension: Secondary | ICD-10-CM | POA: Insufficient documentation

## 2023-01-26 MED ORDER — DICLOFENAC SODIUM 1 % EX GEL: 4.0000 g | Freq: Four times a day (QID) | CUTANEOUS | 0 refills | Status: AC

## 2023-01-26 NOTE — Discharge Instructions (Signed)
Follow the exercises given in your discharge packet to help decrease inflammation in your IT band which is causing the pain.  You may continue to take Tylenol according to the package instructions as needed for pain.  Apply the Voltaren gel to your area of pain up to 4 times daily to help with pain and inflammation.  Apply ice or heat to the area for 20 minutes at a time, 2-3 times a day.  If your pain is not improving either return for re-evaluation, see your PCP, or follow up with [facility].

## 2023-01-26 NOTE — ED Triage Notes (Signed)
Pt is with his granddaughter   Pt c/o right leg pain x4days  Pt states that the pain is in the upper thigh and denies pain in foot or lower leg. Pt states he is having no numbness or tingling in his lower leg but has tingling along the lateral side of his upper leg.  Pt states his blood pressure at home was 175/75  Pt denies pain in his back, hips, or trouble using the restroom.

## 2023-01-26 NOTE — ED Provider Notes (Signed)
[facility]    CSN: [id number] Arrival date & time: 01/26/23  1144      History   Chief Complaint Chief Complaint  Patient presents with   Leg Pain    HPI Shannon Daniels is a 72 y.o. male.   HPI  72 year old male here for evaluation of right thigh pain.  The patient reports that he has been experiencing pain on the outside of his right thigh for the last 4 days.  The pain goes from his hip to his knee.  He denies any swelling of his leg, bruising or redness, and he denies injury.  He does walk with a cane and states that he walks every day and is fairly active.  His other past medical history include diabetes and high blood pressure.  Patient's blood pressure is elevated in clinic at 168/116.  He states he did take his blood pressure medication this morning.  History reviewed. No pertinent past medical history.  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 No pertinent surgical history.     Home Medications    Prior to Admission medications   Medication Sig Start Date End Date Taking? Authorizing Provider  aspirin 81 MG chewable tablet CHEW ONE TABLET BY MOUTH ONCE EVERY DAY 12/31/05  Yes [provider]  atorvastatin (LIPITOR) 40 MG tablet TAKE  ONE-HALF TABLET BY MOUTH AT BEDTIME FOR CHOLESTEROL . (REPLACES ROSUVASTATIN) 04/12/22  Yes [provider]  buPROPion ER (WELLBUTRIN SR) 100 MG 12 hr tablet TAKE TWO TABLETS BY MOUTH TWO TIMES A DAY 04/12/22  Yes [provider]  diclofenac Sodium (VOLTAREN) 1 % GEL Apply 4 g topically 4 (four) times daily. 01/26/23  Yes Margarette Canada, NP  divalproex (DEPAKOTE ER) 500 MG 24 hr tablet TAKE THREE TABLET BY MOUTH ONCE EVERY DAY (SWALLOW WHOLE,DO NOT [place]) 04/12/22  Yes [provider]  glipiZIDE (GLUCOTROL) 10 MG tablet TAKE ONE TABLET BY MOUTH TWICE DAILY BEFORE MORNING AND EVENING MEAL FOR DIABETES 04/12/22  Yes [provider]  insulin glargine-yfgn (SEMGLEE) 100 UNIT/ML injection INJECT 35 UNITS UNDER SKIN AT BEDTIME FOR DIABETES MELLITUS-- DO NOT MIX WITH OTHER INSULINS IN THE SAME SYRINGE-DISCARD BOTTLE 28 DAYS AFTER OPENING **REPLACES LANTUS** 09/13/22  Yes [provider]  lisinopril (ZESTRIL) 40 MG tablet TAKE ONE-HALF TABLET BY MOUTH ONCE EVERY DAY FOR BLOOD PRESSURE 04/12/22  Yes [provider]  metFORMIN (GLUCOPHAGE) 1000 MG tablet TAKE ONE TABLET BY MOUTH TWO TIMES A DAY FOR DIABETES 04/12/22  Yes [provider]  naproxen (NAPROSYN) 375 MG tablet TAKE 1 TABLET BY MOUTH TWO TIMES A DAY AS NEEDED FOR PAIN AND INFLAMMATION. TAKE WITH FOOD 09/13/22  Yes [provider]  QUEtiapine (SEROQUEL) 100 MG tablet TAKE ONE-HALF TABLET BY MOUTH EVERY MORNING AND TAKE ONE TABLET AT BEDTIME 04/12/22  Yes [provider]  Family History History reviewed. No pertinent family history.  Social History Social History   Tobacco Use   Smoking status: Never   Smokeless tobacco: Never  Vaping Use   Vaping Use: Never used  Substance Use Topics   Alcohol use: Not Currently   Drug use: Not Currently     Allergies   Patient has no known allergies.   Review of Systems Review of Systems  Musculoskeletal:  Positive  for myalgias.  Skin:  Negative for color change.  Neurological:  Negative for weakness and numbness.  Hematological: Negative.   Psychiatric/Behavioral: Negative.       Physical Exam Triage Vital Signs ED Triage Vitals  Enc Vitals Group     BP 01/26/23 1208 (!) 168/116     Pulse Rate 01/26/23 1208 95     Resp 01/26/23 1208 18     Temp 01/26/23 1208 97.8 F (36.6 C)     Temp Source 01/26/23 1208 Oral     SpO2 01/26/23 1208 100 %     Weight 01/26/23 1205 220 lb (99.8 kg)     Height 01/26/23 1205 5\' 10"  (1.778 m)     Head Circumference --      Peak Flow --      Pain Score 01/26/23 1204 8     Pain Loc --      Pain Edu? --      Excl. in [place]? --    No data found.  Updated Vital Signs BP (!) 168/116 (BP Location: Left Arm)   Pulse 95   Temp 97.8 F (36.6 C) (Oral)   Resp 18   Ht 5\' 10"  (1.778 m)   Wt 220 lb (99.8 kg)   SpO2 100%   BMI 31.57 kg/m   Visual Acuity Right Eye Distance:   Left Eye Distance:   Bilateral Distance:    Right Eye Near:   Left Eye Near:    Bilateral Near:     Physical Exam Vitals and nursing note reviewed.  Constitutional:      Appearance: Normal appearance. He is not ill-appearing.  HENT:     Head: Normocephalic and atraumatic.  Musculoskeletal:        General: Tenderness present. No swelling, deformity or signs of injury. Normal range of motion.  Skin:    General: Skin is warm and dry.     Capillary Refill: Capillary refill takes less than 2 seconds.     Findings: No bruising or erythema.  Neurological:     General: No focal deficit present.     Mental Status: He is alert and oriented to person, place, and time.  Psychiatric:        Mood and Affect: Mood normal.        Behavior: Behavior normal.        Thought Content: Thought content normal.        Judgment: Judgment normal.      UC Treatments / Results  Labs (all labs ordered are listed, but only abnormal results are displayed) Labs Reviewed - No data to  display  EKG   Radiology No results found.  Procedures Procedures (including critical care time)  Medications Ordered in UC Medications - No data to display  Initial Impression / Assessment and Plan / UC Course  I have reviewed the triage vital signs and the nursing notes.  Pertinent labs & imaging results that were available during my care of the patient were reviewed by me and considered in my medical decision  making (see chart for details).   Patient is a pleasant, nontoxic-appearing 72 year old male here with his granddaughter for evaluation of 4 days worth of lateral thigh pain as outlined in HPI above.  On exam, patient has no swelling, erythema, or ecchymosis to his right thigh.  There is no pain with palpation of the anterior medial portion of the quadriceps complex.  He does have tenderness with palpation of his IT band from his knee to his greater trochanter.  No low back pain or pain in the buttock.  No calf tenderness.  Patient has a negative Bevelyn Buckles' sign on the right.  His extremity is warm and dry and his DP and PT pulses in his right leg are 2+.  Patient exam is consistent with IT band syndrome.  He does ambulate with a cane but he states that he is fairly active and walks daily.  I will have him rest and cut back on his walks.  I will give him home physical therapy exercises to have advised him that he can continue to use Tylenol according to package instructions for pain.  I will also prescribe diclofenac gel that he can apply up to 4 times a day as needed for pain and inflammation.  He can also use ice or moist heat, whichever he prefers to help with the pain.  If his symptoms do not improve, or they worsen, he is to return for reevaluation, see his PCP, or follow-up with [facility].   Final Clinical Impressions(s) / UC Diagnoses   Final diagnoses:  It band syndrome, right     Discharge Instructions      Follow the exercises given in your discharge packet to help  decrease inflammation in your IT band which is causing the pain.  You may continue to take Tylenol according to the package instructions as needed for pain.  Apply the Voltaren gel to your area of pain up to 4 times daily to help with pain and inflammation.  Apply ice or heat to the area for 20 minutes at a time, 2-3 times a day.  If your pain is not improving either return for re-evaluation, see your PCP, or follow up with [facility].     ED Prescriptions     Medication Sig Dispense Auth. Provider   diclofenac Sodium (VOLTAREN) 1 % GEL Apply 4 g topically 4 (four) times daily. 100 g Margarette Canada, NP      PDMP not reviewed this encounter.   Margarette Canada, NP 01/26/23 1241

## 2024-02-24 ENCOUNTER — Other Ambulatory Visit: Payer: Self-pay

## 2024-02-24 ENCOUNTER — Emergency Department: Payer: No Typology Code available for payment source

## 2024-02-24 ENCOUNTER — Emergency Department
Admission: EM | Admit: 2024-02-24 | Discharge: 2024-02-24 | Disposition: A | Discharge status: Home / Self Care | Payer: No Typology Code available for payment source | Attending: Emergency Medicine | Admitting: Emergency Medicine

## 2024-02-24 DIAGNOSIS — J329 Chronic sinusitis, unspecified: Secondary | ICD-10-CM | POA: Insufficient documentation

## 2024-02-24 DIAGNOSIS — U071 COVID-19: Secondary | ICD-10-CM | POA: Diagnosis not present

## 2024-02-24 DIAGNOSIS — F0390 Unspecified dementia without behavioral disturbance: Secondary | ICD-10-CM | POA: Diagnosis not present

## 2024-02-24 DIAGNOSIS — R0602 Shortness of breath: Secondary | ICD-10-CM | POA: Diagnosis present

## 2024-02-24 LAB — CBC WITH DIFFERENTIAL/PLATELET
Abs Immature Granulocytes: 0.06 10*3/uL (ref 0.00–0.07)
Basophils Absolute: 0.1 10*3/uL (ref 0.0–0.1)
Basophils Relative: 0 %
Eosinophils Absolute: 0.1 10*3/uL (ref 0.0–0.5)
Eosinophils Relative: 1 %
HCT: 39.5 % (ref 39.0–52.0)
Hemoglobin: 13.5 g/dL (ref 13.0–17.0)
Immature Granulocytes: 0 %
Lymphocytes Relative: 7 %
Lymphs Abs: 1.2 10*3/uL (ref 0.7–4.0)
MCH: 29.5 pg (ref 26.0–34.0)
MCHC: 34.2 g/dL (ref 30.0–36.0)
MCV: 86.4 fL (ref 80.0–100.0)
Monocytes Absolute: 1.8 10*3/uL — ABNORMAL HIGH (ref 0.1–1.0)
Monocytes Relative: 11 %
Neutro Abs: 13.3 10*3/uL — ABNORMAL HIGH (ref 1.7–7.7)
Neutrophils Relative %: 81 %
Platelets: 247 10*3/uL (ref 150–400)
RBC: 4.57 MIL/uL (ref 4.22–5.81)
RDW: 12.8 % (ref 11.5–15.5)
WBC: 16.6 10*3/uL — ABNORMAL HIGH (ref 4.0–10.5)
nRBC: 0 % (ref 0.0–0.2)

## 2024-02-24 LAB — COMPREHENSIVE METABOLIC PANEL WITH GFR
ALT: 15 U/L (ref 0–44)
AST: 15 U/L (ref 15–41)
Albumin: 3.4 g/dL — ABNORMAL LOW (ref 3.5–5.0)
Alkaline Phosphatase: 61 U/L (ref 38–126)
Anion gap: 10 (ref 5–15)
BUN: 34 mg/dL — ABNORMAL HIGH (ref 8–23)
CO2: 24 mmol/L (ref 22–32)
Calcium: 9.3 mg/dL (ref 8.9–10.3)
Chloride: 99 mmol/L (ref 98–111)
Creatinine, Ser: 1 mg/dL (ref 0.61–1.24)
GFR, Estimated: >60 mL/min
Glucose, Bld: 236 mg/dL — ABNORMAL HIGH (ref 70–99)
Potassium: 4.5 mmol/L (ref 3.5–5.1)
Sodium: 133 mmol/L — ABNORMAL LOW (ref 135–145)
Total Bilirubin: 0.6 mg/dL (ref 0.0–1.2)
Total Protein: 6.4 g/dL — ABNORMAL LOW (ref 6.5–8.1)

## 2024-02-24 LAB — URINALYSIS, ROUTINE W REFLEX MICROSCOPIC
Bacteria, UA: NONE SEEN
Bilirubin Urine: NEGATIVE
Glucose, UA: >=500 mg/dL — AB
Hgb urine dipstick: NEGATIVE
Ketones, ur: 5 mg/dL — AB
Leukocytes,Ua: NEGATIVE
Nitrite: NEGATIVE
Protein, ur: NEGATIVE mg/dL
Specific Gravity, Urine: 1.035 — ABNORMAL HIGH (ref 1.005–1.030)
Squamous Epithelial / HPF: 0 /HPF (ref 0–5)
WBC, UA: 0 WBC/hpf (ref 0–5)
pH: 5 (ref 5.0–8.0)

## 2024-02-24 LAB — TROPONIN I (HIGH SENSITIVITY)
Troponin I (High Sensitivity): 9 ng/L
Troponin I (High Sensitivity): 9 ng/L (ref <18)

## 2024-02-24 LAB — RESP PANEL BY RT-PCR (RSV, FLU A&B, COVID)  RVPGX2
Influenza A by PCR: NEGATIVE
Influenza B by PCR: NEGATIVE
Resp Syncytial Virus by PCR: NEGATIVE
SARS Coronavirus 2 by RT PCR: POSITIVE — AB

## 2024-02-24 LAB — BRAIN NATRIURETIC PEPTIDE: B Natriuretic Peptide: 55.1 pg/mL (ref 0.0–100.0)

## 2024-02-24 LAB — AMMONIA: Ammonia: 14 umol/L (ref 9–35)

## 2024-02-24 LAB — LIPASE, BLOOD: Lipase: 27 U/L (ref 11–51)

## 2024-02-24 LAB — VALPROIC ACID LEVEL: Valproic Acid Lvl: 59 ug/mL (ref 50.0–100.0)

## 2024-02-24 LAB — CK: Total CK: 58 U/L (ref 49–397)

## 2024-02-24 MED ORDER — IOHEXOL 350 MG/ML SOLN
75.0000 mL | Freq: Once | INTRAVENOUS | Status: AC | PRN
  Administered 2024-02-24: 75 mL via INTRAVENOUS

## 2024-02-24 MED ORDER — AMOXICILLIN-POT CLAVULANATE 875-125 MG PO TABS
1.0000 | ORAL_TABLET | Freq: Two times a day (BID) | ORAL | 0 refills | Status: AC
Start: 2024-02-24 — End: 2024-03-05

## 2024-02-24 NOTE — ED Notes (Signed)
 See triage note. Pt reports SHOB with exertion. NAD noted. Call bell in reach. Family at bedside

## 2024-02-24 NOTE — Discharge Instructions (Addendum)
 Reassuring but patient does have COVID which I suspect is causing his weakness.  Stay well-hydrated with Pedialyte return to the ER for worsening symptoms or any other concerns.  I have also started him on Augmentin for possible sinusitis   IMPRESSION: 1. No evidence of pulmonary embolism. 2. Mild mosaic attenuation within the perihilar and bibasilar regions, which can be seen in the setting of small airways disease. 3. Small bilateral pulmonary nodules measuring up to 5 mm. No follow-up needed if patient is low-risk (and has no known or suspected primary neoplasm). Non-contrast chest CT can be considered in 12 months if patient is high-risk. This recommendation follows the consensus statement: Guidelines for Management of Incidental Pulmonary Nodules Detected on CT Images: From the Fleischner Society 2017; Radiology 2017; 284:228-243. 4. Aortic and coronary artery atherosclerosis (ICD10-I70.0).

## 2024-02-24 NOTE — ED Notes (Signed)
 Pt sitting up in chair. Declines to sit in stretcher, declines to be hooked to monitor. Call bell in reach. Verbalizes understanding not to get up on own without assistance from staff.

## 2024-02-24 NOTE — ED Provider Notes (Signed)
-----------------------------------------   5:21 PM on 02/24/2024 -----------------------------------------   Received signout on patient.  73 year old male presenting for shortness of breath and weakness.  Found to be COVID-positive as a source of his symptoms.  Otherwise stable with reassuring laboratory workup.  Plan at signout was to check Depakote level as well as ammonia.  These are thankfully normal with no other concerning findings.  Will discharge according to previous providers plan for treatment of COVID-19 and sinusitis.   Janith Lima, MD 02/24/24 [phone]

## 2024-02-24 NOTE — ED Notes (Signed)
 Pt resting comfortably in bed at this time. Pt is alert and oriented with even and regular respirations. No acute distress noted. Pt denies any needs at this time. Call light within reach. Family remains at bedside with pt.

## 2024-02-24 NOTE — ED Notes (Addendum)
 Pt ambulatory trial performed. Pt tolerated well walking with walker which he uses at baseline. Provider notified

## 2024-02-24 NOTE — ED Notes (Signed)
Pt updated on need for urine sample. Pt denies any need to go at this time. Will let me know when they are able to do so. Urinal provided at bedside  °

## 2024-02-24 NOTE — ED Provider Notes (Signed)
 [facility] Provider Note    Event Date/Time   First MD Initiated Contact with Patient 02/24/24 1018     (approximate)   History   Shortness of Breath and Chest Pain   HPI  Shannon Daniels is a 73 y.o. male who comes in with shortness of breath.  Patient comes in with concern for increasing dizziness, weakness he has had 2 falls over the past 2 nights.  Patient is a poor historian daughter is a nurse in the emergency room and does report that he probably has some undiagnosed dementia but over the past couple of days he has had a change in the way he ambulates, increasing weakness, confusion, not acting his normal self.  He then started developing some shortness of breath as well as chest pain.  He currently denies any chest pain.  She does report that he has been more sedentary over the past few days.   Physical Exam   Triage Vital Signs: ED Triage Vitals  Encounter Vitals Group     BP 02/24/24 1010 (!) 122/109     Systolic BP Percentile --      Diastolic BP Percentile --      Pulse Rate 02/24/24 0959 (!) 103     Resp 02/24/24 1010 17     Temp 02/24/24 1010 98.2 F (36.8 C)     Temp Source 02/24/24 1010 Oral     SpO2 02/24/24 0959 96 %     Weight 02/24/24 1010 220 lb 0.3 oz (99.8 kg)     Height 02/24/24 1010 5\' 10"  (1.778 m)     Head Circumference --      Peak Flow --      Pain Score 02/24/24 1010 5     Pain Loc --      Pain Education --      Exclude from Growth Chart --     Most recent vital signs: Vitals:   02/24/24 1010 02/24/24 1011  BP: (!) 122/109 106/80  Pulse: (!) 105   Resp: 17   Temp: 98.2 F (36.8 C)   SpO2: 97%      General: Awake, no distress.  CV:  Good peripheral perfusion.  Resp:  Normal effort.  Clear lungs Abd:  No distention.  Soft and nontender Other:  Trace edema bilaterally cranial nerves are intact.  Finger-to-nose intact bilaterally.   ED Results / Procedures / Treatments   Labs (all labs ordered are  listed, but only abnormal results are displayed) Labs Reviewed  RESP PANEL BY RT-PCR (RSV, FLU A&B, COVID)  [id number]  CBC WITH DIFFERENTIAL/PLATELET  COMPREHENSIVE METABOLIC PANEL  LIPASE, BLOOD  URINALYSIS, ROUTINE W REFLEX MICROSCOPIC  CK  BRAIN NATRIURETIC PEPTIDE  TROPONIN I (HIGH SENSITIVITY)     EKG  My interpretation of EKG:  Sinus tachycardia rate of 101 without any ST elevation he is a little bit of ST depression in lead III and V5 that could be artifact will get a repeat EKG  Normal sinus rhythm 91 without any ST elevation or T wave inversions, normal intervals  RADIOLOGY I have reviewed the xray personally and interpreted and no evidence of any pneumonia   PROCEDURES:  Critical Care performed: No  Procedures   MEDICATIONS ORDERED IN ED: Medications  iohexol (OMNIPAQUE) 350 MG/ML injection 75 mL (75 mLs Intravenous Contrast Given 02/24/24 1132)     IMPRESSION / MDM / ASSESSMENT AND PLAN / ED COURSE  I reviewed the triage vital signs and the  nursing notes.   Patient's presentation is most consistent with acute presentation with potential threat to life or bodily function.    Patient comes in with increasing weakness, confusion, falls.  Imaging ordered evaluate for intracranial hemorrhage, intracranial mass, cervical fracture, pulmonary embolism given chest x-ray without any evidence of cause.  CT head negative CT cervical negative.  CT does show some concerns for acute sinusitis.  BNP is normal COVID test is positive.  White count elevated at 16.  Labs show slightly low sodium of 133 lipase normal troponin was negative CK normal  IMPRESSION: 1. No evidence of pulmonary embolism. 2. Mild mosaic attenuation within the perihilar and bibasilar regions, which can be seen in the setting of small airways disease. 3. Small bilateral pulmonary nodules measuring up to 5 mm. No follow-up needed if patient is low-risk (and has no known or suspected primary neoplasm).  Non-contrast chest CT can be considered in 12 months if patient is high-risk. This recommendation follows the consensus statement: Guidelines for Management of Incidental Pulmonary Nodules Detected on CT Images: From the Fleischner Society 2017; Radiology 2017; 284:228-243. 4. Aortic and coronary artery atherosclerosis (ICD10-I70.0).  IMPRESSION: 1. No acute intracranial abnormality. 2. No acute fracture or traumatic subluxation of the cervical spine. 3. Pansinus mucosal thickening with air fluid levels in bilateral frontal sinuses, which could be seen with acute sinusitis.  I discussed with patient's daughter and patient has been ambulatory with a walker.  There is no ataxia no significant weakness at this time they feel comfortable with patient going home knowing that this is most likely from COVID.  If this does continue long-term we did give neurology's number for follow-up.  They have family member who can stay with him.  We discussed staying well-hydrated.  Upon discharging them I did notice that patient is on Depakote so I will get a Depakote level just to ensure this is not Depakote toxicity as this can also cause some weakness.  If this results is normal then patient can be discharged home.  Given the concern for possible sinusitis I will also prescribe some antibiotics  Incidental findings discussed with family and provided copy of report The patient is on the cardiac monitor to evaluate for evidence of arrhythmia and/or significant heart rate changes.      FINAL CLINICAL IMPRESSION(S) / ED DIAGNOSES   Final diagnoses:  COVID-19  Sinusitis, unspecified chronicity, unspecified location     Rx / DC Orders   ED Discharge Orders          Ordered    amoxicillin-clavulanate (AUGMENTIN) 875-125 MG tablet  2 times daily        02/24/24 1545             Note:  This document was prepared using Dragon voice recognition software and may include unintentional dictation  errors.   Concha Se, MD 02/24/24 [phone]

## 2024-02-24 NOTE — ED Notes (Signed)
 Pt discharge information reviewed. Pt understands importance for need of follow up care, and when to return if symptoms worsen.  Pt understands all information. All questions answered. Pt brought out of department in wheelchair with family.

## 2024-02-24 NOTE — ED Notes (Signed)
 Pt returned from CT

## 2024-02-24 NOTE — ED Notes (Signed)
 First Nurse Note: Pt to ED via POV c/o shortness of breath. Pts respirations are equal and unlabored, color is WNL, pt is able to speak in complete sentences at this time.

## 2024-02-24 NOTE — ED Triage Notes (Addendum)
 Pt here with sob for a while but cp this morning. Pt has also not been himself per daughter pt has been dizzy and weak. Daughter states the pt said that both legs are stiff. Pt has been walking with a shuffle. Pt also had 2 falls last night, denies hitting his head.

## 2024-07-13 ENCOUNTER — Emergency Department

## 2024-07-13 ENCOUNTER — Emergency Department
Admission: EM | Admit: 2024-07-13 | Discharge: 2024-07-13 | Disposition: A | Discharge status: Home / Self Care | Attending: Emergency Medicine | Admitting: Emergency Medicine

## 2024-07-13 ENCOUNTER — Other Ambulatory Visit: Payer: Self-pay

## 2024-07-13 ENCOUNTER — Encounter: Payer: Self-pay | Admitting: Emergency Medicine

## 2024-07-13 DIAGNOSIS — Y92009 Unspecified place in unspecified non-institutional (private) residence as the place of occurrence of the external cause: Secondary | ICD-10-CM | POA: Diagnosis not present

## 2024-07-13 DIAGNOSIS — Y9301 Activity, walking, marching and hiking: Secondary | ICD-10-CM | POA: Insufficient documentation

## 2024-07-13 DIAGNOSIS — W1839XA Other fall on same level, initial encounter: Secondary | ICD-10-CM | POA: Insufficient documentation

## 2024-07-13 DIAGNOSIS — Z043 Encounter for examination and observation following other accident: Secondary | ICD-10-CM | POA: Insufficient documentation

## 2024-07-13 DIAGNOSIS — W19XXXA Unspecified fall, initial encounter: Secondary | ICD-10-CM

## 2024-07-13 DIAGNOSIS — T675XXA Heat exhaustion, unspecified, initial encounter: Secondary | ICD-10-CM

## 2024-07-13 LAB — CBC
HCT: 37.3 % — ABNORMAL LOW (ref 39.0–52.0)
Hemoglobin: 12.5 g/dL — ABNORMAL LOW (ref 13.0–17.0)
MCH: 29.1 pg (ref 26.0–34.0)
MCHC: 33.5 g/dL (ref 30.0–36.0)
MCV: 86.9 fL (ref 80.0–100.0)
Platelets: 214 K/uL (ref 150–400)
RBC: 4.29 MIL/uL (ref 4.22–5.81)
RDW: 13.1 % (ref 11.5–15.5)
WBC: 8.7 K/uL (ref 4.0–10.5)
nRBC: 0 % (ref 0.0–0.2)

## 2024-07-13 LAB — BRAIN NATRIURETIC PEPTIDE: B Natriuretic Peptide: 20 pg/mL (ref 0.0–100.0)

## 2024-07-13 LAB — RESP PANEL BY RT-PCR (RSV, FLU A&B, COVID)  RVPGX2
Influenza A by PCR: NEGATIVE
Influenza B by PCR: NEGATIVE
Resp Syncytial Virus by PCR: NEGATIVE
SARS Coronavirus 2 by RT PCR: NEGATIVE

## 2024-07-13 LAB — URINALYSIS, COMPLETE (UACMP) WITH MICROSCOPIC
Bacteria, UA: NONE SEEN
Bilirubin Urine: NEGATIVE
Glucose, UA: 50 mg/dL — AB
Hgb urine dipstick: NEGATIVE
Ketones, ur: 5 mg/dL — AB
Leukocytes,Ua: NEGATIVE
Nitrite: NEGATIVE
Protein, ur: NEGATIVE mg/dL
Specific Gravity, Urine: 1.016 (ref 1.005–1.030)
Squamous Epithelial / HPF: 0 /HPF (ref 0–5)
pH: 6 (ref 5.0–8.0)

## 2024-07-13 LAB — COMPREHENSIVE METABOLIC PANEL WITH GFR
ALT: 13 U/L (ref 0–44)
AST: 20 U/L (ref 15–41)
Albumin: 3.2 g/dL — ABNORMAL LOW (ref 3.5–5.0)
Alkaline Phosphatase: 53 U/L (ref 38–126)
Anion gap: 11 (ref 5–15)
BUN: 29 mg/dL — ABNORMAL HIGH (ref 8–23)
CO2: 21 mmol/L — ABNORMAL LOW (ref 22–32)
Calcium: 9.6 mg/dL (ref 8.9–10.3)
Chloride: 108 mmol/L (ref 98–111)
Creatinine, Ser: 1.13 mg/dL (ref 0.61–1.24)
GFR, Estimated: >60 mL/min (ref >60)
Glucose, Bld: 121 mg/dL — ABNORMAL HIGH (ref 70–99)
Potassium: 4.4 mmol/L (ref 3.5–5.1)
Sodium: 140 mmol/L (ref 135–145)
Total Bilirubin: 0.8 mg/dL (ref 0.0–1.2)
Total Protein: 6.3 g/dL — ABNORMAL LOW (ref 6.5–8.1)

## 2024-07-13 LAB — TROPONIN I (HIGH SENSITIVITY)
Troponin I (High Sensitivity): 6 ng/L (ref <18)
Troponin I (High Sensitivity): 6 ng/L (ref <18)

## 2024-07-13 LAB — CK: Total CK: 51 U/L (ref 49–397)

## 2024-07-13 MED ORDER — SODIUM CHLORIDE 0.9 % IV BOLUS
1000.0000 mL | Freq: Once | INTRAVENOUS | Status: AC
  Administered 2024-07-13: 1000 mL via INTRAVENOUS

## 2024-07-13 NOTE — Discharge Instructions (Addendum)
 Please make sure to use your walker or your cane when you are ambulating.  Please make sure to keep yourself hydrated.  If you are on that heat, make sure that you have some shade and to be started feel hot please make sure to go back indoors.  I would like you to follow-up with your primary care doctor sometime next week to get reassessed.

## 2024-07-13 NOTE — ED Triage Notes (Signed)
 Pt comes in by EMS after unwitnessed fall and confusion at home.  Pt was given 1500 NS for hypotension and is now normotensive.  Temp 100.8 for EMS.

## 2024-07-13 NOTE — ED Notes (Signed)
 RN assisted pt to use the urinal. Pt daughter helped RN stand pt to do so. Pt was very unsteady while urinating.

## 2024-07-13 NOTE — ED Provider Notes (Signed)
.-----------------------------------------   3:06 PM on 07/13/2024 -----------------------------------------  Blood pressure 120/80, pulse 95, temperature 97.6 F (36.4 C), height 5' 8 (1.727 m), weight 84.8 kg, SpO2 93%.  Assuming care from Dr. Hall.  In short, Shannon Daniels is a 73 y.o. male with a chief complaint of Fall .  Refer to the original H&P for additional details.  The current plan of care is to follow-up labs, reassess, if afebrile in feeling better, able to be discharged.  On reassessment patient is feeling a lot better, asymptomatic at this time.  Discussed with him as well as daughters about imaging and lab results including incidental findings.  They agreeable plan for discharge.  Recommended hydration, sun protection, not to stay on the heat and to use his walker and cane when he is ambulating since he does not use them consistently.  Also instructed him to follow his primary care doctor to get reassessed next week.  Considered but no indication for inpatient admission at this time, he safe for outpatient management.  Will discharge with strict return precautions.  Clinical Course as of 07/13/24 1753  Fri Jul 13, 2024  1510 CT HEAD WO CONTRAST ( ) IMPRESSION: 1. No acute intracranial abnormality. No skull fracture. 2. Stable atrophy.   [TT]  1629 DG Chest 2 View No active cardiopulmonary disease.  [TT]  1655 Troponin I (High Sensitivity) Troponin is not elevated x 2 [TT]  1731 Urinalysis, Complete w Microscopic -Urine, Clean Catch(!) Not consistent with UTI. [TT]  1749 Resp panel by RT-PCR (RSV, Flu A&B, Covid) Anterior Nasal Swab Negative [TT]  1751 Of note patient was charted as 96% on 2 L but he has been on room air.  He was actually put on 2 L by EMS but he denies any shortness of breath.  Was at 100% on room air while I was in the room. [TT]    Clinical Course User Index [TT] Waymond Lorelle Cummins, MD      Waymond Lorelle Cummins, MD 07/13/24 [phone]

## 2024-07-13 NOTE — ED Provider Notes (Signed)
 [facility] Provider Note    Event Date/Time   First MD Initiated Contact with Patient 07/13/24 1404     (approximate)  History   Chief Complaint: Fall  HPI  Shannon Daniels is a 73 y.o. male who presents to the emergency department for a fall.  According to the daughter patient had a fall yesterday at the home, was walking out to check the mail today when he had another fall and was down on the ground for at least 15 minutes up to an hour before family noticed and called EMS.  Unsure if the patient hit his head.  Unsure if the patient passed out.  Daughter states the patient has a history of falls and is supposed to use a walker/cane but often does not do so.  Patient states he was outside in the yard and believes he may have gotten too hot.  Denies any chest pain or trouble breathing at any point.  Patient has no complaints at this time.  Physical Exam   Triage Vital Signs: ED Triage Vitals  Encounter Vitals Group     BP 07/13/24 1358 120/80     Girls Systolic BP Percentile --      Girls Diastolic BP Percentile --      Boys Systolic BP Percentile --      Boys Diastolic BP Percentile --      Pulse Rate 07/13/24 1358 95     Resp --      Temp 07/13/24 1358 97.6 F (36.4 C)     Temp src --      SpO2 07/13/24 1358 93 %     Weight 07/13/24 1401 187 lb (84.8 kg)     Height 07/13/24 1401 5' 8 (1.727 m)     Head Circumference --      Peak Flow --      Pain Score 07/13/24 1401 0     Pain Loc --      Pain Education --      Exclude from Growth Chart --     Most recent vital signs: Vitals:   07/13/24 1358  BP: 120/80  Pulse: 95  Temp: 97.6 F (36.4 C)  SpO2: 93%    General: Awake, no distress.  CV:  Good peripheral perfusion.  Regular rate and rhythm  Resp:  Normal effort.  Equal breath sounds bilaterally.  Abd:  No distention.  Soft, nontender.  No rebound or guarding.  ED Results / Procedures / Treatments   EKG  EKG viewed and  interpreted by myself shows a normal sinus rhythm at 95 bpm with a narrow QRS, left axis deviation, largely normal intervals with no concerning ST changes.  RADIOLOGY  I have reviewed interpret the CT head images.  No obvious bleed seen on my evaluation.   MEDICATIONS ORDERED IN ED: Medications  sodium chloride  0.9 % bolus 1,000 mL (has no administration in time range)     IMPRESSION / MDM / ASSESSMENT AND PLAN / ED COURSE  I reviewed the triage vital signs and the nursing notes.  Patient's presentation is most consistent with acute presentation with potential threat to life or bodily function.  Patient presents to the emergency department after a fall.  Patient had a fall yesterday as well as today per daughter.  Unclear if he hit his head.  Will obtain CT imaging of the head as a precaution.  Will check labs including CBC chemistry troponin and a CK.  We will IV  hydrate and continue to closely monitor while awaiting results.  No concerning findings on EKG.  Patient is awake alert he has no complaints.  CBC reassuring.  Patient care signed out to oncoming provider.  FINAL CLINICAL IMPRESSION(S) / ED DIAGNOSES   Fall   Note:  This document was prepared using Dragon voice recognition software and may include unintentional dictation errors.   Dorothyann Drivers, MD 07/14/24 [phone]
